# Patient Record
Sex: Female | Born: 1985 | Race: Black or African American | Hispanic: No | Marital: Single | Smoking: Former smoker
Health system: Southern US, Community
[De-identification: ages and names within clinical notes are randomized; demographics above are authoritative.]

## PROBLEM LIST (undated history)

## (undated) DIAGNOSIS — D649 Anemia, unspecified: Secondary | ICD-10-CM

## (undated) HISTORY — PX: OTHER SURGICAL HISTORY: SHX169

---

## 2013-05-18 ENCOUNTER — Encounter (HOSPITAL_BASED_OUTPATIENT_CLINIC_OR_DEPARTMENT_OTHER): Payer: Self-pay | Admitting: Emergency Medicine

## 2013-05-18 ENCOUNTER — Emergency Department (HOSPITAL_BASED_OUTPATIENT_CLINIC_OR_DEPARTMENT_OTHER)
Admission: EM | Admit: 2013-05-18 | Discharge: 2013-05-18 | Disposition: A | Discharge status: Home / Self Care | Payer: BC Managed Care – PPO | Attending: Emergency Medicine | Admitting: Emergency Medicine

## 2013-05-18 ENCOUNTER — Emergency Department (HOSPITAL_BASED_OUTPATIENT_CLINIC_OR_DEPARTMENT_OTHER): Payer: BC Managed Care – PPO

## 2013-05-18 DIAGNOSIS — Z3202 Encounter for pregnancy test, result negative: Secondary | ICD-10-CM | POA: Insufficient documentation

## 2013-05-18 DIAGNOSIS — R630 Anorexia: Secondary | ICD-10-CM | POA: Insufficient documentation

## 2013-05-18 DIAGNOSIS — R112 Nausea with vomiting, unspecified: Secondary | ICD-10-CM | POA: Insufficient documentation

## 2013-05-18 DIAGNOSIS — M545 Low back pain, unspecified: Secondary | ICD-10-CM | POA: Insufficient documentation

## 2013-05-18 DIAGNOSIS — Z87891 Personal history of nicotine dependence: Secondary | ICD-10-CM | POA: Insufficient documentation

## 2013-05-18 DIAGNOSIS — Z862 Personal history of diseases of the blood and blood-forming organs and certain disorders involving the immune mechanism: Secondary | ICD-10-CM | POA: Insufficient documentation

## 2013-05-18 DIAGNOSIS — Z79899 Other long term (current) drug therapy: Secondary | ICD-10-CM | POA: Insufficient documentation

## 2013-05-18 DIAGNOSIS — R1013 Epigastric pain: Secondary | ICD-10-CM | POA: Insufficient documentation

## 2013-05-18 HISTORY — DX: Anemia, unspecified: D64.9

## 2013-05-18 LAB — LIPASE, BLOOD: Lipase: 25 U/L (ref 11–59)

## 2013-05-18 LAB — COMPREHENSIVE METABOLIC PANEL
ALBUMIN: 3.1 g/dL — AB (ref 3.5–5.2)
ALT: 19 U/L (ref 0–35)
AST: 17 U/L (ref 0–37)
Alkaline Phosphatase: 57 U/L (ref 39–117)
BUN: 11 mg/dL (ref 6–23)
CALCIUM: 8.2 mg/dL — AB (ref 8.4–10.5)
CO2: 24 mEq/L (ref 19–32)
CREATININE: 0.9 mg/dL (ref 0.50–1.10)
Chloride: 103 mEq/L (ref 96–112)
GFR calc Af Amer: >90 mL/min (ref >90)
GFR, EST NON AFRICAN AMERICAN: 88 mL/min — AB (ref >90)
Glucose, Bld: 87 mg/dL (ref 70–99)
Potassium: 3.7 mEq/L (ref 3.7–5.3)
Sodium: 138 mEq/L (ref 137–147)
TOTAL PROTEIN: 6.7 g/dL (ref 6.0–8.3)
Total Bilirubin: <0.2 mg/dL — ABNORMAL LOW (ref 0.3–1.2)

## 2013-05-18 LAB — CBC WITH DIFFERENTIAL/PLATELET
BASOS ABS: 0 10*3/uL (ref 0.0–0.1)
Basophils Relative: 0 % (ref 0–1)
EOS ABS: 0 10*3/uL (ref 0.0–0.7)
Eosinophils Relative: 0 % (ref 0–5)
HCT: 35.4 % — ABNORMAL LOW (ref 36.0–46.0)
Hemoglobin: 11.4 g/dL — ABNORMAL LOW (ref 12.0–15.0)
LYMPHS ABS: 0.9 10*3/uL (ref 0.7–4.0)
Lymphocytes Relative: 28 % (ref 12–46)
MCH: 22.8 pg — AB (ref 26.0–34.0)
MCHC: 32.2 g/dL (ref 30.0–36.0)
MCV: 70.8 fL — AB (ref 78.0–100.0)
MONO ABS: 0.2 10*3/uL (ref 0.1–1.0)
Monocytes Relative: 7 % (ref 3–12)
Neutro Abs: 2 10*3/uL (ref 1.7–7.7)
Neutrophils Relative %: 65 % (ref 43–77)
PLATELETS: 199 10*3/uL (ref 150–400)
RBC: 5 MIL/uL (ref 3.87–5.11)
RDW: 14.9 % (ref 11.5–15.5)
WBC: 3.1 10*3/uL — ABNORMAL LOW (ref 4.0–10.5)

## 2013-05-18 LAB — URINALYSIS, ROUTINE W REFLEX MICROSCOPIC
GLUCOSE, UA: NEGATIVE mg/dL
Hgb urine dipstick: NEGATIVE
Ketones, ur: NEGATIVE mg/dL
LEUKOCYTES UA: NEGATIVE
Nitrite: NEGATIVE
Protein, ur: NEGATIVE mg/dL
Specific Gravity, Urine: 1.029 (ref 1.005–1.030)
Urobilinogen, UA: 2 mg/dL — ABNORMAL HIGH (ref 0.0–1.0)
pH: 6 (ref 5.0–8.0)

## 2013-05-18 LAB — PREGNANCY, URINE: PREG TEST UR: NEGATIVE

## 2013-05-18 MED ORDER — GI COCKTAIL ~~LOC~~
30.0000 mL | Freq: Once | ORAL | Status: AC
  Administered 2013-05-18: 30 mL via ORAL
  Filled 2013-05-18: qty 30

## 2013-05-18 MED ORDER — ONDANSETRON HCL 4 MG PO TABS: 4.0000 mg | ORAL_TABLET | Freq: Four times a day (QID) | ORAL | Status: AC

## 2013-05-18 MED ORDER — RANITIDINE HCL 150 MG PO CAPS: 150.0000 mg | ORAL_CAPSULE | Freq: Every day | ORAL | Status: AC

## 2013-05-18 NOTE — ED Provider Notes (Signed)
CSN: [id number]     Arrival date & time 05/18/13  1454 History  This chart was scribed for Anita Cisco, MD by Beverly Milch, ED Scribe. This patient was seen in room MH07/MH07 and the patient's care was started at 4:51 PM.    Chief Complaint  Patient presents with  . Back Pain  . Abdominal Pain      The history is provided by the patient and a parent. No language interpreter was used.   HPI Comments: Anita Mcdaniel is a 28 y.o. female who presents to the Emergency Department complaining of an episode of intermittent "right," epigastric pain radiating to the back that began yesterday. Pt state she was at a wedding and the pain began while eating dinner around 9 PM. Pt reports a similar episode Wednesday and that she's had decreased appetite, nausea, lower back pain since Monday. She reports associated nausea and bloating and an episode of vomiting Wednesday evening which did relieve some of her pain. She states she took motrin and used a heating pad with minimal relief. Pt denies dysuria, vaginal bleeding and discharge. Pt denies h/o abdominal surgery. Mother reports she has had a cholecystectomy. Pt states she doesn't smoke tobacco or use drugs recreationally. Pt states she does drink alcohol socially. She states she has not begun any new medications. She reports she hasn't traveled or gone camping recently. She states she drinks about 1 cup coffee daily.  Denies heavy NSAID use.     Past Medical History  Diagnosis Date  . Anemia    Past Surgical History  Procedure Laterality Date  . Other surgical history      cyst on right ear   No family history on file. History  Substance Use Topics  . Smoking status: Former Games developer  . Smokeless tobacco: Not on file  . Alcohol Use: Yes   OB History   Grav Para Term Preterm Abortions TAB SAB Ect Mult Living                 Review of Systems  Constitutional: Negative for fever, chills, diaphoresis, activity change, appetite change and  fatigue.  HENT: Negative for congestion, facial swelling, rhinorrhea and sore throat.   Eyes: Negative for photophobia and discharge.  Respiratory: Negative for cough, chest tightness and shortness of breath.   Cardiovascular: Negative for chest pain, palpitations and leg swelling.  Gastrointestinal: Positive for nausea, vomiting and abdominal pain (epigatric). Negative for diarrhea.  Endocrine: Negative for polydipsia and polyuria.  Genitourinary: Negative for dysuria, frequency, difficulty urinating and pelvic pain.  Musculoskeletal: Positive for back pain (lower). Negative for arthralgias, neck pain and neck stiffness.  Skin: Negative for color change and wound.  Allergic/Immunologic: Negative for immunocompromised state.  Neurological: Negative for facial asymmetry, weakness, numbness and headaches.  Hematological: Does not bruise/bleed easily.  Psychiatric/Behavioral: Negative for confusion and agitation.      Allergies  Review of patient's allergies indicates no known allergies.  Home Medications   Current Outpatient Rx  Name  Route  Sig  Dispense  Refill  . cetirizine (ZYRTEC) 10 MG chewable tablet   Oral   Chew 10 mg by mouth daily.         . norgestimate-ethinyl estradiol (ORTHO-CYCLEN,SPRINTEC,PREVIFEM) 0.25-35 MG-MCG tablet   Oral   Take 1 tablet by mouth daily.         . sertraline (ZOLOFT) 100 MG tablet   Oral   Take 75 mg by mouth daily.         Marland Kitchen  ondansetron (ZOFRAN) 4 MG tablet   Oral   Take 1 tablet (4 mg total) by mouth every 6 (six) hours.   12 tablet   0   . ranitidine (ZANTAC) 150 MG capsule   Oral   Take 1 capsule (150 mg total) by mouth daily.   30 capsule   0    Triage Vitals: BP 113/71  Pulse 88  Temp(Src) 99.1 F (37.3 C) (Oral)  Resp 20  SpO2 100%  LMP 05/11/2013  Physical Exam  Nursing note and vitals reviewed. Constitutional: She is oriented to person, place, and time. She appears well-developed and well-nourished. No  distress.  HENT:  Head: Normocephalic.  Mouth/Throat: Oropharynx is clear and moist.  Eyes: Pupils are equal, round, and reactive to light.  Neck: Normal range of motion. Neck supple.  Cardiovascular: Normal rate, regular rhythm and normal heart sounds.   Pulmonary/Chest: Effort normal and breath sounds normal. No respiratory distress. She has no wheezes.  Abdominal: Soft. She exhibits no distension. There is tenderness (epigastric ). There is no rebound and no guarding.  Musculoskeletal: She exhibits no edema and no tenderness.  Neurological: She is alert and oriented to person, place, and time.  Skin: Skin is warm and dry.  Psychiatric: She has a normal mood and affect.    ED Course  Procedures (including critical care time)  DIAGNOSTIC STUDIES: Oxygen Saturation is 100% on RA, normal by my interpretation.    COORDINATION OF CARE: 4:59 PM- Discussed obtaining blood for diagnostic labs with Pt and Pt's parents. Pt advised of plan for treatment and pt agrees.   Labs Review Labs Reviewed  CBC WITH DIFFERENTIAL - Abnormal; Notable for the following:    WBC 3.1 (*)    Hemoglobin 11.4 (*)    HCT 35.4 (*)    MCV 70.8 (*)    MCH 22.8 (*)    All other components within normal limits  COMPREHENSIVE METABOLIC PANEL - Abnormal; Notable for the following:    Calcium 8.2 (*)    Albumin 3.1 (*)    Total Bilirubin <0.2 (*)    GFR calc non Af Amer 88 (*)    All other components within normal limits  URINALYSIS, ROUTINE W REFLEX MICROSCOPIC - Abnormal; Notable for the following:    Bilirubin Urine SMALL (*)    Urobilinogen, UA 2.0 (*)    All other components within normal limits  URINE CULTURE  LIPASE, BLOOD  PREGNANCY, URINE   Imaging Review US Abdomen Complete  05/18/2013   CLINICAL DATA:  Epigastric pain with nausea and vomiting.  EXAM: ULTRASOUND ABDOMEN COMPLETE  COMPARISON:  None.  FINDINGS: Gallbladder:  No gallstones or wall thickening visualized. No sonographic Murphy sign  noted.  Common bile duct:  Diameter: 1.8 mm, normal.  Liver:  No focal lesion identified. Within normal limits in parenchymal echogenicity.  IVC:  Normal.  Pancreas:  Normal.  Spleen:  Normal.  5.7 cm in length.  Right Kidney:  Length: 10.6 cm. There is slight prominence of the renal collecting system of the right lower pole which diminishes after voiding. 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 No significant abnormality. Echogenicity within normal limits. No mass.  Left Kidney:  Length: 12.3 cm. Echogenicity within normal limits. No mass or hydronephrosis visualized.  Abdominal aorta:  Normal.  2.3 cm maximum diameter.  Other findings:  Normal appearing bladder.  IMPRESSION: No significant abnormality. 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   Electronically Signed  By: Geanie CooleyJim  Maxwell M.D.   On: 05/18/2013 18:17     EKG Interpretation None      MDM   Final diagnoses:  Epigastric pain   Pt is a 28 y.o. female with Pmhx as above who presents with about 6 days of intermittent epigastric pain w/ radiation to the back, indigestion, fullness, anorexia, chills. On PE, she has low grade temp, mild ttp over epigastrium, negative Murphy's sign. WBC not elevated, CMP grossly unremarkable, lipase nml, UA/POC preg nml. RUQ US done which was negative.  Doubt acute life threatening cause of pain such as SBO, cholecystitis, appendicitis.  Suspect gastric pathology such as gastritis, PUD, GERD. Will start trial of Zantac.  Return precautions given for new or worsening symptoms including worsening pain, fever, inability to tolerate PO.    I personally performed the services described in this documentation, which was scribed in my presence. The recorded information has been reviewed and is accurate.     Anita CiscoMegan E Macklin Jacquin, MD 05/19/13 1116

## 2013-05-18 NOTE — Discharge Instructions (Signed)
Abdominal Pain, Adult °Many things can cause abdominal pain. Usually, abdominal pain is not caused by a disease and will improve without treatment. It can often be observed and treated at home. Your health care provider will do a physical exam and possibly order blood tests and X-rays to help determine the seriousness of your pain. However, in many cases, more time must pass before a clear cause of the pain can be found. Before that point, your health care provider may not know if you need more testing or further treatment. °HOME CARE INSTRUCTIONS  °Monitor your abdominal pain for any changes. The following actions may help to alleviate any discomfort you are experiencing: °· Only take over-the-counter or prescription medicines as directed by your health care provider. °· Do not take laxatives unless directed to do so by your health care provider. °· Try a clear liquid diet (broth, tea, or water) as directed by your health care provider. Slowly move to a bland diet as tolerated. °SEEK MEDICAL CARE IF: °· You have unexplained abdominal pain. °· You have abdominal pain associated with nausea or diarrhea. °· You have pain when you urinate or have a bowel movement. °· You experience abdominal pain that wakes you in the night. °· You have abdominal pain that is worsened or improved by eating food. °· You have abdominal pain that is worsened with eating fatty foods. °SEEK IMMEDIATE MEDICAL CARE IF:  °· Your pain does not go away within 2 hours. °· You have a fever. °· You keep throwing up (vomiting). °· Your pain is felt only in portions of the abdomen, such as the right side or the left lower portion of the abdomen. °· You pass bloody or black tarry stools. °MAKE SURE YOU: °· Understand these instructions.   °· Will watch your condition.   °· Will get help right away if you are not doing well or get worse.   °Document Released: 11/09/2004 Document Revised: 11/20/2012 Document Reviewed: 10/09/2012 °[employer]® Patient  Information ©2014 [employer]. ° °

## 2013-05-18 NOTE — ED Notes (Signed)
Onset of upper abdominal pain radiating into her back.  C/o indigestion symptoms.  Reports sensation that food is not being processed through her GI tract like it should.  [facility].  Denies vomiting, diarrhea, fever, vaginal discharge, or dysuria.

## 2013-05-19 LAB — URINE CULTURE
CULTURE: NO GROWTH
Colony Count: NO GROWTH

## 2014-06-08 ENCOUNTER — Emergency Department (HOSPITAL_BASED_OUTPATIENT_CLINIC_OR_DEPARTMENT_OTHER)
Admission: EM | Admit: 2014-06-08 | Discharge: 2014-06-09 | Disposition: A | Discharge status: Home / Self Care | Payer: BLUE CROSS/BLUE SHIELD | Attending: Emergency Medicine | Admitting: Emergency Medicine

## 2014-06-08 ENCOUNTER — Encounter (HOSPITAL_BASED_OUTPATIENT_CLINIC_OR_DEPARTMENT_OTHER): Payer: Self-pay | Admitting: *Deleted

## 2014-06-08 DIAGNOSIS — Z793 Long term (current) use of hormonal contraceptives: Secondary | ICD-10-CM | POA: Diagnosis not present

## 2014-06-08 DIAGNOSIS — K5900 Constipation, unspecified: Secondary | ICD-10-CM | POA: Diagnosis not present

## 2014-06-08 DIAGNOSIS — Z87891 Personal history of nicotine dependence: Secondary | ICD-10-CM | POA: Insufficient documentation

## 2014-06-08 DIAGNOSIS — Z79899 Other long term (current) drug therapy: Secondary | ICD-10-CM | POA: Insufficient documentation

## 2014-06-08 DIAGNOSIS — Z3202 Encounter for pregnancy test, result negative: Secondary | ICD-10-CM | POA: Insufficient documentation

## 2014-06-08 DIAGNOSIS — Z862 Personal history of diseases of the blood and blood-forming organs and certain disorders involving the immune mechanism: Secondary | ICD-10-CM | POA: Insufficient documentation

## 2014-06-08 DIAGNOSIS — R630 Anorexia: Secondary | ICD-10-CM | POA: Diagnosis not present

## 2014-06-08 DIAGNOSIS — R112 Nausea with vomiting, unspecified: Secondary | ICD-10-CM | POA: Insufficient documentation

## 2014-06-08 LAB — URINALYSIS, ROUTINE W REFLEX MICROSCOPIC
Bilirubin Urine: NEGATIVE
GLUCOSE, UA: NEGATIVE mg/dL
Hgb urine dipstick: NEGATIVE
KETONES UR: 15 mg/dL — AB
Leukocytes, UA: NEGATIVE
Nitrite: NEGATIVE
PROTEIN: NEGATIVE mg/dL
Specific Gravity, Urine: 1.022 (ref 1.005–1.030)
Urobilinogen, UA: 1 mg/dL (ref 0.0–1.0)
pH: 6 (ref 5.0–8.0)

## 2014-06-08 LAB — PREGNANCY, URINE: PREG TEST UR: NEGATIVE

## 2014-06-08 MED ORDER — PROMETHAZINE HCL 25 MG PO TABS
25.0000 mg | ORAL_TABLET | Freq: Once | ORAL | Status: AC
  Administered 2014-06-08: 25 mg via ORAL
  Filled 2014-06-08: qty 1

## 2014-06-08 NOTE — ED Provider Notes (Signed)
CSN: [id number]     Arrival date & time 06/08/14  2122 History   First MD Initiated Contact with Patient 06/08/14 2255     Chief Complaint  Patient presents with  . Vomiting      (Consider location/radiation/quality/duration/timing/severity/associated sxs/prior Treatment) HPI  Pt is a 29yo female presenting to ED with c/o nausea and vomiting x4 since last night. Denies abdominal pain, fever, chill, or diarrhea. Pt does report constipation states she had a small BM yesterday morning.  Pt reports hx of constipation, started her stool softeners this morning w/o relief.  Denies sick contacts or recent travel.  No hx of abdominal surgeries.    Past Medical History  Diagnosis Date  . Anemia    Past Surgical History  Procedure Laterality Date  . Other surgical history      cyst on right ear   No family history on file. History  Substance Use Topics  . Smoking status: Former Games developermoker  . Smokeless tobacco: Not on file  . Alcohol Use: Yes   OB History    No data available     Review of Systems  Constitutional: Positive for appetite change. Negative for fever, chills and diaphoresis.  Respiratory: Negative for cough and shortness of breath.   Cardiovascular: Negative for chest pain and palpitations.  Gastrointestinal: Positive for nausea, vomiting and constipation. Negative for abdominal pain and diarrhea.  Genitourinary: Negative for dysuria, urgency, frequency, hematuria, flank pain, vaginal bleeding and vaginal discharge.  All other systems reviewed and are negative.     Allergies  Review of patient's allergies indicates no known allergies.  Home Medications   Prior to Admission medications   Medication Sig Start Date End Date Taking? Authorizing Provider  cetirizine (ZYRTEC) 10 MG chewable tablet Chew 10 mg by mouth daily.    Historical Provider, MD  norgestimate-ethinyl estradiol (ORTHO-CYCLEN,SPRINTEC,PREVIFEM) 0.25-35 MG-MCG tablet Take 1 tablet by mouth daily.     Historical Provider, MD  ondansetron (ZOFRAN) 4 MG tablet Take 1 tablet (4 mg total) by mouth every 6 (six) hours. 05/18/13   Toy CookeyMegan Docherty, MD  promethazine (PHENERGAN) 25 MG tablet Take 1 tablet (25 mg total) by mouth every 6 (six) hours as needed for nausea or vomiting. 06/09/14   Junius FinnerErin O'Malley, PA-C  ranitidine (ZANTAC) 150 MG capsule Take 1 capsule (150 mg total) by mouth daily. 05/18/13   Toy CookeyMegan Docherty, MD  sertraline (ZOLOFT) 100 MG tablet Take 75 mg by mouth daily.    Historical Provider, MD   BP 118/80 mmHg  Pulse 76  Temp(Src) 97.4 F (36.3 C) (Oral)  Resp 20  Ht 5' 7.5" (1.715 m)  Wt 220 lb (99.791 kg)  BMI 33.93 kg/m2  SpO2 100%  LMP 06/03/2014 Physical Exam  Constitutional: She appears well-developed and well-nourished. No distress.  Pt standing up changing channel on television when this provider entered the room  HENT:  Head: Normocephalic and atraumatic.  Eyes: Conjunctivae are normal. No scleral icterus.  Neck: Normal range of motion. Neck supple.  Cardiovascular: Normal rate, regular rhythm and normal heart sounds.   Pulmonary/Chest: Effort normal and breath sounds normal. No respiratory distress. She has no wheezes. She has no rales. She exhibits no tenderness.  Abdominal: Soft. Bowel sounds are normal. She exhibits no distension and no mass. There is no tenderness. There is no rebound and no guarding.  Musculoskeletal: Normal range of motion.  Neurological: She is alert.  Skin: Skin is warm and dry. She is not diaphoretic.  Nursing note and  vitals reviewed.   ED Course  Procedures (including critical care time) Labs Review Labs Reviewed  URINALYSIS, ROUTINE W REFLEX MICROSCOPIC - Abnormal; Notable for the following:    APPearance CLOUDY (*)    Ketones, ur 15 (*)    All other components within normal limits  PREGNANCY, URINE    Imaging Review No results found.   EKG Interpretation None      MDM   Final diagnoses:  Non-intractable vomiting with  nausea, vomiting of unspecified type   Pt is a 29yo female c/o nausea and vomiting x4 since yesterday. Denies abdominal pain. Abdomen is soft, non-tender. Pt appears well, non-toxic. Vitals: WNL.  UA: unremarkable. Urine preg: negative.  Doubt emergent process taking place, including but not limited to cholecystitis, SBO, or appendicitis.  Discussed pt with Dr. Read Drivers, will hold off on labs and imaging at this time as symptoms sound viral in nature.   Pt given phenergan in ED and was able to keep down several ounces of PO fluids. No vomiting while in ED.  Pt still c/o nausea but continued to deny abdominal pain. zofran given in ED. Will discharge home to f/u with PCP. Home care instructions provided. Return precautions provided. Pt verbalized understanding and agreement with tx plan.   Junius Finner, PA-C 06/09/14 0015  Paula Libra, MD 06/09/14 [phone]

## 2014-06-08 NOTE — ED Notes (Signed)
Nausea and vomiting since last night. Feels tired. No diarrhea.

## 2014-06-08 NOTE — ED Notes (Addendum)
C/o nausea onset yesterday and vomited x 4 today  Denies pain

## 2014-06-09 MED ORDER — PROMETHAZINE HCL 25 MG PO TABS: 25.0000 mg | ORAL_TABLET | Freq: Four times a day (QID) | ORAL | Status: AC | PRN

## 2014-06-09 MED ORDER — ONDANSETRON 4 MG PO TBDP
4.0000 mg | ORAL_TABLET | Freq: Once | ORAL | Status: AC
  Administered 2014-06-09: 4 mg via ORAL
  Filled 2014-06-09: qty 1

## 2014-06-09 NOTE — Discharge Instructions (Signed)

## 2015-03-02 IMAGING — US US ABDOMEN COMPLETE
1 series · 14 of 25 positions shown · non-contrast
Comparison: None.

CLINICAL DATA: Epigastric pain with nausea and vomiting.

EXAM:
ULTRASOUND ABDOMEN COMPLETE

[Series 1: us abdomen complete · 0.28mm/px · 14 of 104 slices shown]
[im 1/104]
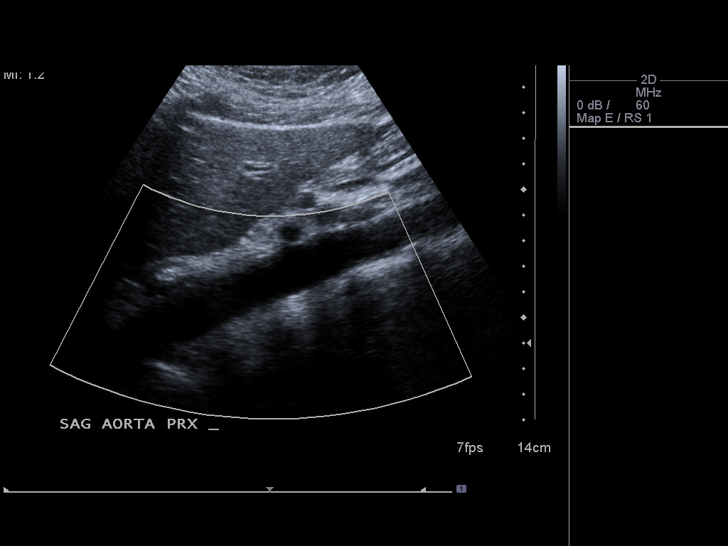
[im 9/104]
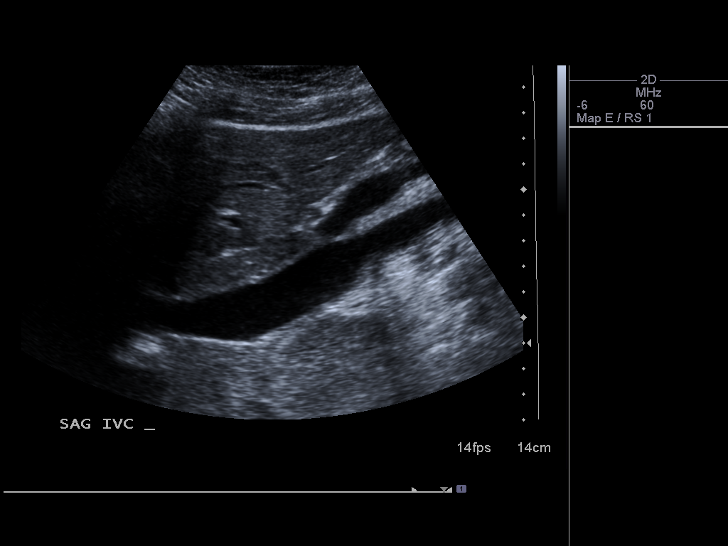
[im 18/104]
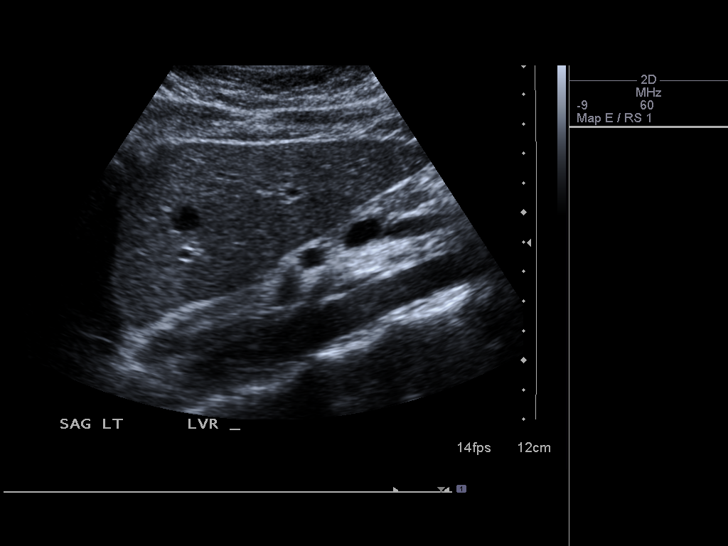
[im 26/104]
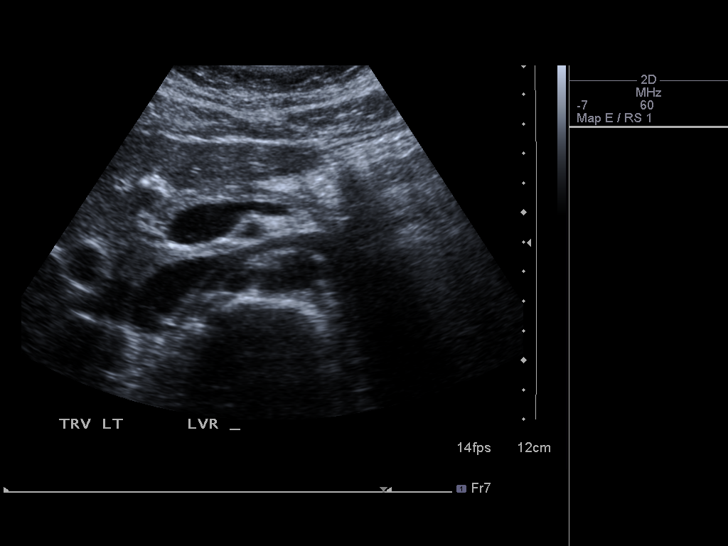
[im 35/104]
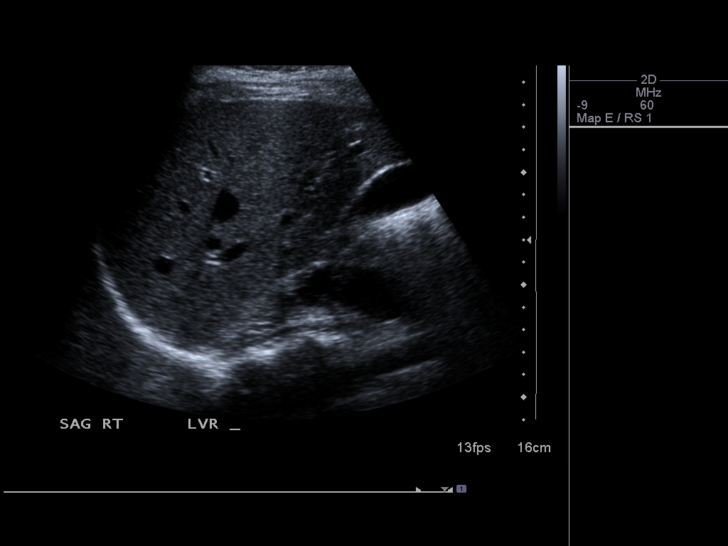
[im 39/104]
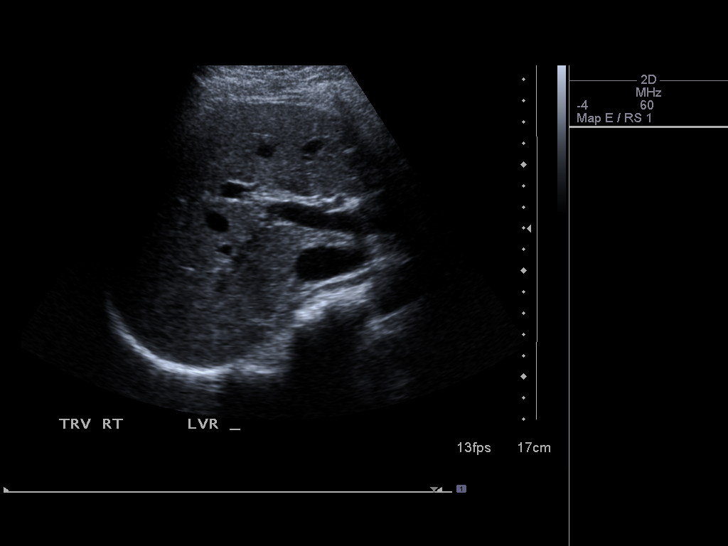
[im 48/104]
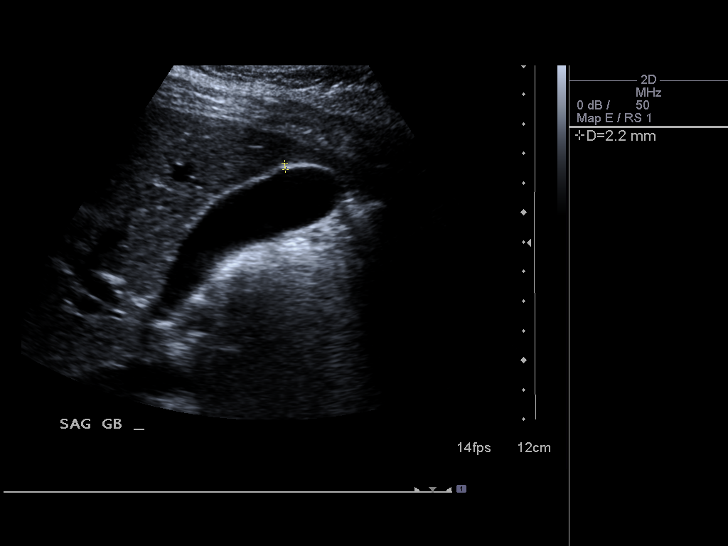
[im 56/104]
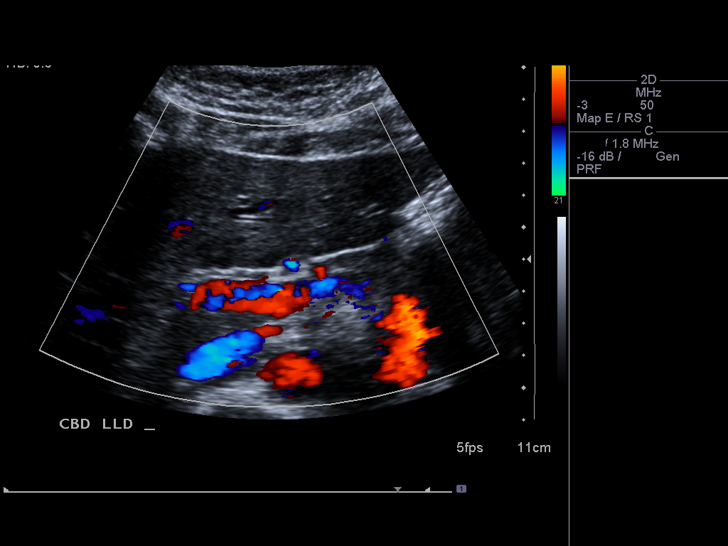
[im 65/104]
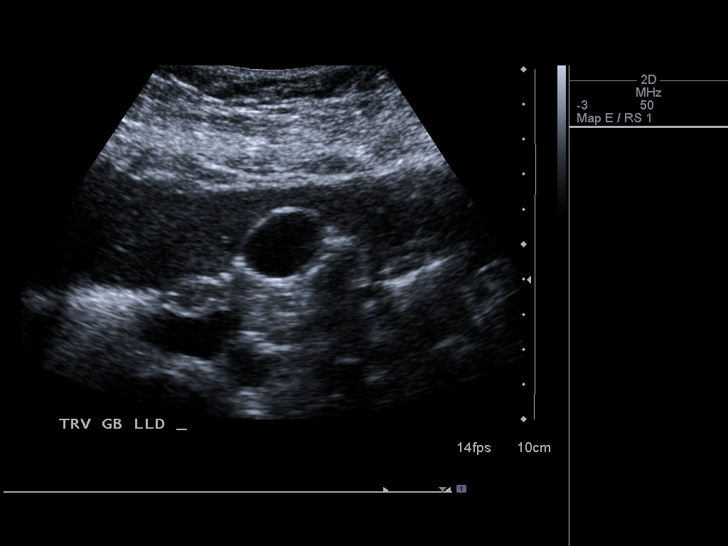
[im 69/104]
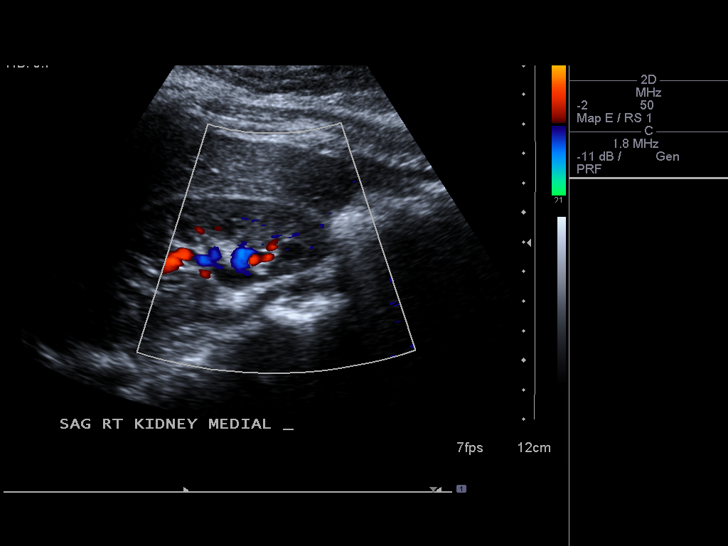
[im 78/104]
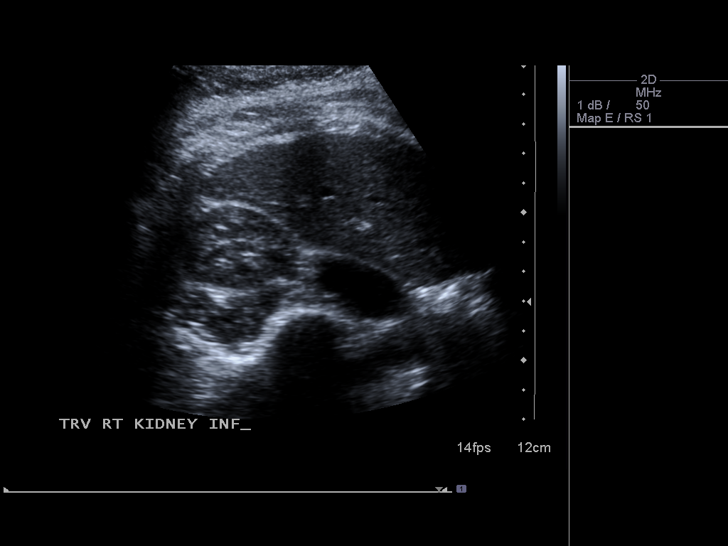
[im 86/104]
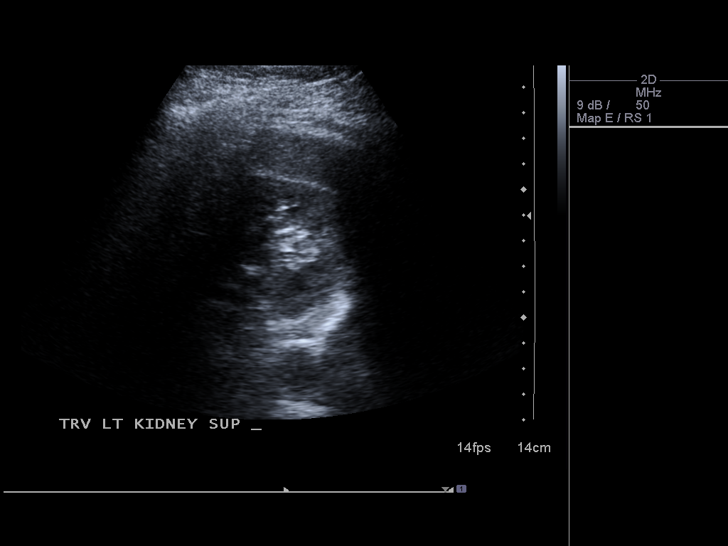
[im 95/104]
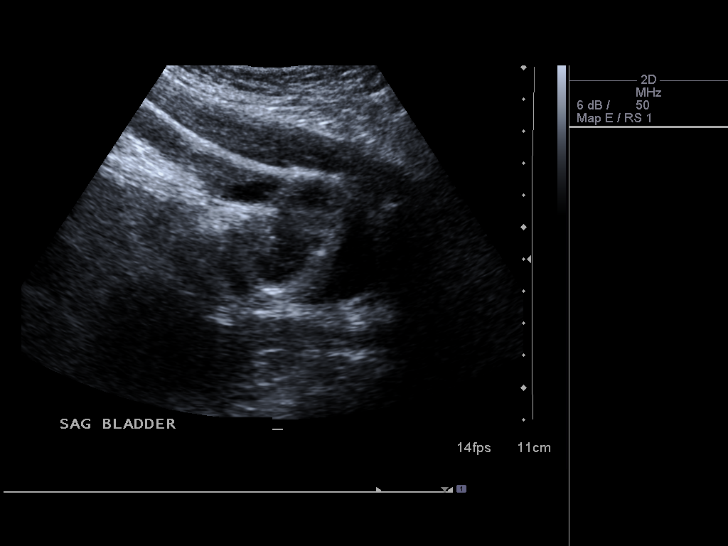
[im 104/104]
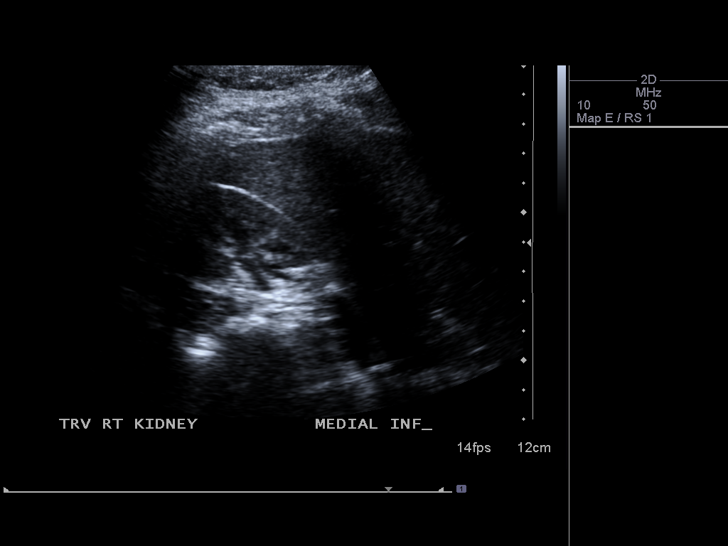

[14 of 25 positions shown; findings below may reference images not displayed]

FINDINGS: Gallbladder:

No gallstones or wall thickening visualized. No sonographic Murphy
sign noted.

Common bile duct:

Diameter: 1.8 mm, normal.

Liver:

No focal lesion identified. Within normal limits in parenchymal
echogenicity.

IVC:

Normal.

Pancreas:

Normal.

Spleen:

Normal.  5.7 cm in length.

Right Kidney:

Length: 10.6 cm. There is slight prominence of the renal collecting
system of the right lower pole which diminishes after voiding. I
suspect the patient has a duplex right renal collecting system. No
significant abnormality. Echogenicity within normal limits. No mass.

Left Kidney:

Length: 12.3 cm. Echogenicity within normal limits. No mass or
hydronephrosis visualized.

Abdominal aorta:

Normal.  2.3 cm maximum diameter.

Other findings:

Normal appearing bladder.
IMPRESSION: No significant abnormality. Probable duplex right renal collecting
system.
